# Patient Record
Sex: Male | Born: 1960 | Race: Black or African American | Hispanic: No | Marital: Married | Smoking: Never smoker
Health system: Southern US, Community
[De-identification: ages and names within clinical notes are randomized; demographics above are authoritative.]

## PROBLEM LIST (undated history)

## (undated) DIAGNOSIS — E119 Type 2 diabetes mellitus without complications: Secondary | ICD-10-CM

## (undated) DIAGNOSIS — E785 Hyperlipidemia, unspecified: Secondary | ICD-10-CM

## (undated) HISTORY — PX: NO PAST SURGERIES: SHX2092

---

## 2005-03-04 ENCOUNTER — Emergency Department: Payer: Self-pay | Admitting: Emergency Medicine
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
## 2010-02-04 ENCOUNTER — Ambulatory Visit: Payer: Self-pay | Admitting: Internal Medicine

## 2010-10-29 ENCOUNTER — Ambulatory Visit: Payer: Self-pay | Admitting: Internal Medicine

## 2011-03-01 ENCOUNTER — Ambulatory Visit: Payer: Self-pay | Admitting: Internal Medicine

## 2013-07-21 ENCOUNTER — Ambulatory Visit: Payer: Self-pay | Admitting: Physician Assistant

## 2013-07-22 LAB — GC/CHLAMYDIA PROBE AMP

## 2014-09-03 ENCOUNTER — Ambulatory Visit
Admit: 2014-09-03 | Disposition: A | Discharge status: Home / Self Care | Payer: Self-pay | Attending: Family Medicine | Admitting: Family Medicine

## 2014-09-03 LAB — URINALYSIS, COMPLETE
BLOOD: NEGATIVE
Bacteria: NEGATIVE
Bilirubin,UR: NEGATIVE
Glucose,UR: NEGATIVE
Ketone: NEGATIVE
Nitrite: NEGATIVE
PH: 6.5 (ref 5.0–8.0)
PROTEIN: NEGATIVE
Specific Gravity: 1.025 (ref 1.000–1.030)
WBC UR: >30 /HPF (ref 0–5)

## 2014-09-04 LAB — GC/CHLAMYDIA PROBE AMP

## 2018-08-02 ENCOUNTER — Ambulatory Visit
Admission: EM | Admit: 2018-08-02 | Discharge: 2018-08-02 | Disposition: A | Discharge status: Home / Self Care | Payer: Managed Care, Other (non HMO) | Attending: Family Medicine | Admitting: Family Medicine

## 2018-08-02 ENCOUNTER — Other Ambulatory Visit: Payer: Self-pay

## 2018-08-02 ENCOUNTER — Encounter: Payer: Self-pay | Admitting: Emergency Medicine

## 2018-08-02 DIAGNOSIS — J029 Acute pharyngitis, unspecified: Secondary | ICD-10-CM | POA: Diagnosis not present

## 2018-08-02 DIAGNOSIS — R21 Rash and other nonspecific skin eruption: Secondary | ICD-10-CM | POA: Diagnosis not present

## 2018-08-02 MED ORDER — DESONIDE 0.05 % EX CREA: TOPICAL_CREAM | Freq: Two times a day (BID) | CUTANEOUS | 0 refills | Status: AC

## 2018-08-02 NOTE — ED Triage Notes (Signed)
Pt c/o sore throat. Started 4 days ago. Worse in the mornings. He had a sexual encounter and is concerned for infection. Also c/o "flaky skin" on the end of his penis. Denies itching, discharge, or burning etc.
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
## 2018-08-02 NOTE — Discharge Instructions (Signed)
No evidence of infection.  No need to worry.  Medication as prescribed for rash.  Take care  Dr. Adriana Simas

## 2018-12-16 ENCOUNTER — Other Ambulatory Visit: Payer: Self-pay | Admitting: Family Medicine

## 2019-02-12 ENCOUNTER — Other Ambulatory Visit: Payer: Self-pay | Admitting: Family Medicine

## 2020-06-26 ENCOUNTER — Ambulatory Visit
Admission: EM | Admit: 2020-06-26 | Discharge: 2020-06-26 | Disposition: A | Discharge status: Home / Self Care | Payer: Managed Care, Other (non HMO) | Attending: Family Medicine | Admitting: Family Medicine

## 2020-06-26 ENCOUNTER — Encounter: Payer: Self-pay | Admitting: Emergency Medicine

## 2020-06-26 ENCOUNTER — Other Ambulatory Visit: Payer: Self-pay

## 2020-06-26 DIAGNOSIS — B349 Viral infection, unspecified: Secondary | ICD-10-CM

## 2020-06-26 DIAGNOSIS — Z20822 Contact with and (suspected) exposure to covid-19: Secondary | ICD-10-CM

## 2020-06-26 DIAGNOSIS — R059 Cough, unspecified: Secondary | ICD-10-CM | POA: Insufficient documentation

## 2020-06-26 LAB — SARS CORONAVIRUS 2 (TAT 6-24 HRS): SARS Coronavirus 2: NEGATIVE

## 2020-06-26 NOTE — Discharge Instructions (Signed)

## 2020-06-26 NOTE — ED Triage Notes (Signed)
Pt c/o cough, sore throat, subjective fever, nasal congestion, headache and body aches. Started about 3 days ago. Wife recently tested positive for covid.

## 2020-06-26 NOTE — ED Provider Notes (Signed)
[facility]    CSN: [id number] Arrival date & time: 06/26/20  1035      History   Chief Complaint Chief Complaint  Patient presents with   Cough   Covid Exposure    HPI Jesse Schroeder Sunday is a 60 y.o. male presenting for 3-day history of body aches, feeling feverish, nasal congestion, cough, sore throat and headaches.  Patient states that his wife was recently diagnosed with COVID-19.  He has been fully vaccinated for COVID-19.  He says he has been taking over-the-counter Tylenol, but no other medications.  He denies any high-grade fever, chest pain, difficulty breathing, abdominal pain, nausea/vomiting or diarrhea.  Denies any history of cardiopulmonary disease.  States that he is healthy and runs or walks 5 miles per day.  States that he has been too fatigued to run or walk the usual since he has been sick.  He has no other complaints or concerns.  HPI  History reviewed. No pertinent past medical history.  There are no problems to display for this patient.   Past Surgical History:  Procedure Laterality Date   NO PAST SURGERIES         Home Medications    Prior to Admission medications   Medication Sig Start Date End Date Taking? Authorizing Provider  clonazePAM (KLONOPIN) 1 MG tablet Take by mouth. 04/28/18  Yes [provider]  desonide (DESOWEN) 0.05 % cream Apply topically 2 (two) times daily. 08/02/18   Tommie Sams, DO    Family History Family History  Problem Relation Age of Onset   Heart attack Mother     Social History Social History   Tobacco Use   Smoking status: Never Smoker   Smokeless tobacco: Never Used  Vaping Use   Vaping Use: Never used  Substance Use Topics   Alcohol use: Not Currently   Drug use: Not Currently     Allergies   Patient has no known allergies.   Review of Systems Review of Systems  Constitutional: Positive for fatigue and fever.  HENT: Positive for congestion, rhinorrhea and sore throat.  Negative for sinus pressure and sinus pain.   Respiratory: Positive for cough. Negative for shortness of breath.   Gastrointestinal: Negative for abdominal pain, diarrhea, nausea and vomiting.  Musculoskeletal: Positive for myalgias.  Neurological: Positive for headaches. Negative for weakness and light-headedness.  Hematological: Negative for adenopathy.     Physical Exam Triage Vital Signs ED Triage Vitals  Enc Vitals Group     BP 06/26/20 1128 (!) 145/81     Pulse Rate 06/26/20 1128 (!) 56     Resp 06/26/20 1128 18     Temp 06/26/20 1128 97.7 F (36.5 C)     Temp Source 06/26/20 1128 Oral     SpO2 06/26/20 1128 100 %     Weight 06/26/20 1125 289 lb 0.4 oz (131.1 kg)     Height 06/26/20 1125 5\' 11"  (1.803 m)     Head Circumference --      Peak Flow --      Pain Score 06/26/20 1125 3     Pain Loc --      Pain Edu? --      Excl. in GC? --    No data found.  Updated Vital Signs BP (!) 145/81 (BP Location: Left Arm)    Pulse (!) 56    Temp 97.7 F (36.5 C) (Oral)    Resp 18    Ht 5\' 11"  (1.803 m)  Wt 289 lb 0.4 oz (131.1 kg)    SpO2 100%    BMI 40.31 kg/m    Physical Exam Vitals and nursing note reviewed.  Constitutional:      General: He is not in acute distress.    Appearance: Normal appearance. He is well-developed and well-nourished. He is not ill-appearing, toxic-appearing or diaphoretic.  HENT:     Head: Normocephalic and atraumatic.     Nose: Congestion and rhinorrhea present.     Mouth/Throat:     Mouth: Mucous membranes are moist.     Pharynx: Oropharynx is clear.  Eyes:     General: No scleral icterus.    Conjunctiva/sclera: Conjunctivae normal.  Cardiovascular:     Rate and Rhythm: Regular rhythm. Bradycardia present.     Heart sounds: Normal heart sounds.  Pulmonary:     Effort: Pulmonary effort is normal. No respiratory distress.     Breath sounds: Normal breath sounds. No wheezing, rhonchi or rales.  Musculoskeletal:        General: No edema.      Cervical back: Neck supple.  Skin:    General: Skin is warm and dry.  Neurological:     General: No focal deficit present.     Mental Status: He is alert. Mental status is at baseline.     Motor: No weakness.     Gait: Gait normal.  Psychiatric:        Mood and Affect: Mood and affect and mood normal.        Behavior: Behavior normal.        Thought Content: Thought content normal.      UC Treatments / Results  Labs (all labs ordered are listed, but only abnormal results are displayed) Labs Reviewed  SARS CORONAVIRUS 2 (TAT 6-24 HRS)    EKG   Radiology No results found.  Procedures Procedures (including critical care time)  Medications Ordered in UC Medications - No data to display  Initial Impression / Assessment and Plan / UC Course  I have reviewed the triage vital signs and the nursing notes.  Pertinent labs & imaging results that were available during my care of the patient were reviewed by me and considered in my medical decision making (see chart for details).   60 year old male presenting for 3-day history of cough, congestion, fatigue and positive COVID-19 exposure in his household.  In the clinic, he is in no acute distress and is afebrile.  Exam only significant for mild nasal congestion and rhinorrhea.  Suspect viral illness, likely COVID-19 given exposure.  Current CDC guidelines, isolation protocol and ED precautions reviewed with patient.  Advised supportive care with increasing rest and fluids and taking over-the-counter cough medication/decongestants.  Advised to follow-up with her clinic as needed for any new or worsening symptoms.   Final Clinical Impressions(s) / UC Diagnoses   Final diagnoses:  Viral illness  Cough  Exposure to COVID-19 virus     Discharge Instructions     You have received COVID testing today either for positive exposure, concerning symptoms that could be related to COVID infection, screening purposes, or re-testing after  confirmed positive.  Your test obtained today checks for active viral infection in the last 1-2 weeks. If your test is negative now, you can still test positive later. So, if you do develop symptoms you should either get re-tested and/or isolate x 5 days and then strict mask use x 5 days (unvaccinated) or mask use x 10 days (vaccinated). Please follow  CDC guidelines.  While Rapid antigen tests come back in 15-20 minutes, send out PCR/molecular test results typically come back within 1-3 days. In the mean time, if you are symptomatic, assume this could be a positive test and treat/monitor yourself as if you do have COVID.   We will call with test results if positive. Please download the MyChart app and set up a profile to access test results.   If symptomatic, go home and rest. Push fluids. Take Tylenol as needed for discomfort. Gargle warm salt water. Throat lozenges. Take Mucinex DM or Robitussin for cough. Humidifier in bedroom to ease coughing. Warm showers. Also review the COVID handout for more information.  COVID-19 INFECTION: The incubation period of COVID-19 is approximately 14 days after exposure, with most symptoms developing in roughly 4-5 days. Symptoms may range in severity from mild to critically severe. Roughly 80% of those infected will have mild symptoms. People of any age may become infected with COVID-19 and have the ability to transmit the virus. The most common symptoms include: fever, fatigue, cough, body aches, headaches, sore throat, nasal congestion, shortness of breath, nausea, vomiting, diarrhea, changes in smell and/or taste.    COURSE OF ILLNESS Some patients may begin with mild disease which can progress quickly into critical symptoms. If your symptoms are worsening please call ahead to the Emergency Department and proceed there for further treatment. Recovery time appears to be roughly 1-2 weeks for mild symptoms and 3-6 weeks for severe disease.   GO IMMEDIATELY TO ER FOR  FEVER YOU ARE UNABLE TO GET DOWN WITH TYLENOL, BREATHING PROBLEMS, CHEST PAIN, FATIGUE, LETHARGY, INABILITY TO EAT OR DRINK, ETC  QUARANTINE AND ISOLATION: To help decrease the spread of COVID-19 please remain isolated if you have COVID infection or are highly suspected to have COVID infection. This means -stay home and isolate to one room in the home if you live with others. Do not share a bed or bathroom with others while ill, sanitize and wipe down all countertops and keep common areas clean and disinfected. Stay home for 5 days. If you have no symptoms or your symptoms are resolving after 5 days, you can leave your house. Continue to wear a mask around others for 5 additional days. If you have been in close contact (within 6 feet) of someone diagnosed with COVID 19, you are advised to quarantine in your home for 14 days as symptoms can develop anywhere from 2-14 days after exposure to the virus. If you develop symptoms, you  must isolate.  Most current guidelines for COVID after exposure -unvaccinated: isolate 5 days and strict mask use x 5 days. Test on day 5 is possible -vaccinated: wear mask x 10 days if symptoms do not develop -You do not necessarily need to be tested for COVID if you have + exposure and  develop symptoms. Just isolate at home x10 days from symptom onset During this global pandemic, CDC advises to practice social distancing, try to stay at least 47ft away from others at all times. Wear a face covering. Wash and sanitize your hands regularly and avoid going anywhere that is not necessary.  KEEP IN MIND THAT THE COVID TEST IS NOT 100% ACCURATE AND YOU SHOULD STILL DO EVERYTHING TO PREVENT POTENTIAL SPREAD OF VIRUS TO OTHERS (WEAR MASK, WEAR GLOVES, WASH HANDS AND SANITIZE REGULARLY). IF INITIAL TEST IS NEGATIVE, THIS MAY NOT MEAN YOU ARE DEFINITELY NEGATIVE. MOST ACCURATE TESTING IS DONE 5-7 DAYS AFTER EXPOSURE.   It is not  advised by CDC to get re-tested after receiving a positive  COVID test since you can still test positive for weeks to months after you have already cleared the virus.   *If you have not been vaccinated for COVID, I strongly suggest you consider getting vaccinated as long as there are no contraindications.      ED Prescriptions    None     PDMP not reviewed this encounter.   Shirlee Latch, PA-C 06/26/20 1250

## 2022-07-12 ENCOUNTER — Encounter: Payer: Self-pay | Admitting: Emergency Medicine

## 2022-07-12 ENCOUNTER — Ambulatory Visit: Admission: EM | Admit: 2022-07-12 | Discharge: 2022-07-12 | Payer: Managed Care, Other (non HMO)

## 2022-07-12 DIAGNOSIS — H538 Other visual disturbances: Secondary | ICD-10-CM

## 2022-07-12 DIAGNOSIS — R519 Headache, unspecified: Secondary | ICD-10-CM | POA: Diagnosis not present

## 2022-07-12 DIAGNOSIS — R410 Disorientation, unspecified: Secondary | ICD-10-CM

## 2022-07-12 HISTORY — DX: Hyperlipidemia, unspecified: E78.5

## 2022-07-12 NOTE — Discharge Instructions (Addendum)
Please go back to [facility] for reevaluation and repeat imaging of your head.  Please have your son take you.  Go there as an as possible.
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
## 2024-02-22 ENCOUNTER — Ambulatory Visit
Admission: EM | Admit: 2024-02-22 | Discharge: 2024-02-22 | Disposition: A | Discharge status: Home / Self Care | Attending: Physician Assistant | Admitting: Physician Assistant

## 2024-02-22 DIAGNOSIS — R209 Unspecified disturbances of skin sensation: Secondary | ICD-10-CM | POA: Insufficient documentation

## 2024-02-22 DIAGNOSIS — E785 Hyperlipidemia, unspecified: Secondary | ICD-10-CM | POA: Insufficient documentation

## 2024-02-22 DIAGNOSIS — E119 Type 2 diabetes mellitus without complications: Secondary | ICD-10-CM | POA: Insufficient documentation

## 2024-02-22 DIAGNOSIS — Z7984 Long term (current) use of oral hypoglycemic drugs: Secondary | ICD-10-CM | POA: Insufficient documentation

## 2024-02-22 DIAGNOSIS — Z7985 Long-term (current) use of injectable non-insulin antidiabetic drugs: Secondary | ICD-10-CM | POA: Diagnosis not present

## 2024-02-22 DIAGNOSIS — Z113 Encounter for screening for infections with a predominantly sexual mode of transmission: Secondary | ICD-10-CM | POA: Insufficient documentation

## 2024-02-22 HISTORY — DX: Type 2 diabetes mellitus without complications: E11.9

## 2024-02-22 LAB — URINALYSIS, W/ REFLEX TO CULTURE (INFECTION SUSPECTED)
Bilirubin Urine: NEGATIVE
Glucose, UA: NEGATIVE mg/dL
Hgb urine dipstick: NEGATIVE
Ketones, ur: NEGATIVE mg/dL
Leukocytes,Ua: NEGATIVE
Nitrite: NEGATIVE
Protein, ur: NEGATIVE mg/dL
Specific Gravity, Urine: 1.02 (ref 1.005–1.030)
Squamous Epithelial / HPF: NONE SEEN /HPF (ref 0–5)
WBC, UA: NONE SEEN WBC/hpf (ref 0–5)
pH: 7 (ref 5.0–8.0)

## 2024-02-22 NOTE — ED Triage Notes (Signed)
 Patient states that he has a tickle type irritation on the inside of his penis. No discharge. No lesions. Patient states that this has been going on for 2 weeks. Patient is sexually active.

## 2024-02-22 NOTE — ED Provider Notes (Signed)
 [facility]    CSN: [id number] Arrival date & time: 02/22/24  9180      History   Chief Complaint Chief Complaint  Patient presents with   penis problem    HPI Jesse Schroeder is a 63 y.o. male with history of diabetes and hyperlipidemia. He presents today for an irritation and tickle-type sensation inside his penis. Denies swelling, rashes, genital lesions, penile discharge, rashes, urinary frequency, dysuria, difficulty urinating, testicular pain or swelling.  Reports recently having sexual encounter with a new male partner and says symptoms started shortly after.  He reports that he used a condom, but it did break. Patient is open to STI testing.  HPI  Past Medical History:  Diagnosis Date   Diabetes mellitus without complication (HCC)    Hyperlipidemia     There are no active problems to display for this patient.   Past Surgical History:  Procedure Laterality Date   NO PAST SURGERIES         Home Medications    Prior to Admission medications   Medication Sig Start Date End Date Taking? Authorizing Provider  clonazePAM (KLONOPIN) 1 MG tablet Take by mouth. 04/28/18  Yes [provider]  metFORMIN (GLUCOPHAGE) 1000 MG tablet Take 1,000 mg by mouth 2 (two) times daily.   Yes [provider]  MOUNJARO 10 MG/0.5ML Pen SMARTSIG:0.5 Milliliter(s) SUB-Q Once a Week 01/02/24  Yes [provider]  desonide  (DESOWEN ) 0.05 % cream Apply topically 2 (two) times daily. 08/02/18   Cook, Jayce G, DO  ezetimibe (ZETIA) 10 MG tablet Take 10 mg by mouth daily.    [provider]  OZEMPIC, 1 MG/DOSE, 4 MG/3ML SOPN Inject 1 mg into the skin once a week. 03/09/22   [provider]    Family History Family History  Problem Relation Age of Onset   Heart attack Mother     Social History Social History   Tobacco Use   Smoking status: Never   Smokeless tobacco: Never  Vaping Use   Vaping status: Never Used  Substance  Use Topics   Alcohol use: Not Currently   Drug use: Not Currently     Allergies   Patient has no known allergies.   Review of Systems Review of Systems  Constitutional:  Negative for fatigue and fever.  Gastrointestinal:  Negative for abdominal pain, nausea and vomiting.  Genitourinary:  Negative for dysuria, frequency, genital sores, hematuria, penile discharge, penile pain, penile swelling, scrotal swelling, testicular pain and urgency.       Penile irritation and tickle type sensation  Musculoskeletal:  Negative for arthralgias.  Skin:  Negative for rash.  Neurological:  Negative for weakness.     Physical Exam Triage Vital Signs ED Triage Vitals  Encounter Vitals Group     BP 02/22/24 0901 (!) 151/86     Girls Systolic BP Percentile --      Girls Diastolic BP Percentile --      Boys Systolic BP Percentile --      Boys Diastolic BP Percentile --      Pulse Rate 02/22/24 0901 67     Resp 02/22/24 0901 17     Temp 02/22/24 0901 98.6 F (37 C)     Temp Source 02/22/24 0901 Oral     SpO2 02/22/24 0901 97 %     Weight 02/22/24 0859 299 lb (135.6 kg)     Height --      Head Circumference --  Peak Flow --      Pain Score 02/22/24 0859 0     Pain Loc --      Pain Education --      Exclude from Growth Chart --    No data found.  Updated Vital Signs BP (!) 151/86 (BP Location: Right Arm)   Pulse 67   Temp 98.6 F (37 C) (Oral)   Resp 17   Wt 299 lb (135.6 kg)   SpO2 97%   BMI 41.70 kg/m   Physical Exam Vitals and nursing note reviewed. Exam conducted with a chaperone present (Tiffany, MA).  Constitutional:      General: He is not in acute distress.    Appearance: Normal appearance. He is well-developed. He is not ill-appearing.  HENT:     Head: Normocephalic and atraumatic.  Eyes:     Conjunctiva/sclera: Conjunctivae normal.  Cardiovascular:     Rate and Rhythm: Normal rate.  Pulmonary:     Effort: Pulmonary effort is normal. No respiratory  distress.  Abdominal:     Palpations: Abdomen is soft.     Tenderness: There is no abdominal tenderness.  Genitourinary:    Penis: Circumcised. No tenderness, discharge, swelling or lesions.      Testes: Normal.  Musculoskeletal:     Cervical back: Neck supple.  Skin:    General: Skin is warm and dry.     Capillary Refill: Capillary refill takes less than 2 seconds.  Neurological:     General: No focal deficit present.     Mental Status: He is alert. Mental status is at baseline.     Motor: No weakness.     Gait: Gait normal.  Psychiatric:        Mood and Affect: Mood normal.        Behavior: Behavior normal.      UC Treatments / Results  Labs (all labs ordered are listed, but only abnormal results are displayed) Labs Reviewed  URINALYSIS, W/ REFLEX TO CULTURE (INFECTION SUSPECTED)  CYTOLOGY, (ORAL, ANAL, URETHRAL) ANCILLARY ONLY    EKG   Radiology No results found.  Procedures Procedures (including critical care time)  Medications Ordered in UC Medications - No data to display  Initial Impression / Assessment and Plan / UC Course  I have reviewed the triage vital signs and the nursing notes.  Pertinent labs & imaging results that were available during my care of the patient were reviewed by me and considered in my medical decision making (see chart for details).   63 year old male presents for penile irritation symptom without rash, discharge, dysuria, genital lesions or swelling.  Reports a recent sexual encounter with a new male.  The condom he was wearing did break.  Denies any obvious symptoms in the partner.  On evaluation, no abnormal physical exam findings.  Swab obtained for GC/chlamydia/trichomonas.  Urinalysis also obtained. UA negative. Discussed with patient.   Will await results before beginning treatment as his symptoms are subjective and may not be consistent with STI or other infection. Advised PCP follow up.   Final Clinical Impressions(s)  / UC Diagnoses   Final diagnoses:  Sensation disturbance of skin  Routine screening for STI (sexually transmitted infection)     Discharge Instructions      -Urine test not consistent with UTI -No abnormalities on exam -Testing you for sexually transmitted infections. Will call with any positive results which should come back tomorrow or the following day -At this time, increase rest and fluids -If  positive STI no sex until  1 week after you and partners complete treatment.     ED Prescriptions   None    PDMP not reviewed this encounter.   Arvis Jolan NOVAK, PA-C 02/22/24 [phone]

## 2024-02-22 NOTE — Discharge Instructions (Addendum)
-  Urine test not consistent with UTI -No abnormalities on exam -Testing you for sexually transmitted infections. Will call with any positive results which should come back tomorrow or the following day -At this time, increase rest and fluids -If positive STI no sex until  1 week after you and partners complete treatment. -If still having symptoms and negative testing please follow up with PCP

## 2024-02-23 LAB — CYTOLOGY, (ORAL, ANAL, URETHRAL) ANCILLARY ONLY
Chlamydia: NEGATIVE
Comment: NEGATIVE
Comment: NEGATIVE
Comment: NORMAL
Neisseria Gonorrhea: NEGATIVE
Trichomonas: NEGATIVE
